# Patient Record
Sex: Female | Born: 1980 | Race: White | Hispanic: No | Marital: Single | Smoking: Never smoker
Health system: Southern US, Community
[De-identification: ages and names within clinical notes are randomized; demographics above are authoritative.]

---
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
## 2020-07-30 ENCOUNTER — Other Ambulatory Visit: Payer: Self-pay

## 2020-07-30 ENCOUNTER — Ambulatory Visit (INDEPENDENT_AMBULATORY_CARE_PROVIDER_SITE_OTHER): Payer: 59

## 2020-07-30 ENCOUNTER — Ambulatory Visit
Admission: RE | Admit: 2020-07-30 | Discharge: 2020-07-30 | Disposition: A | Discharge status: Home / Self Care | Payer: 59 | Source: Ambulatory Visit | Attending: Physician Assistant | Admitting: Physician Assistant

## 2020-07-30 VITALS — BP 117/72 | HR 80 | Temp 98.1°F | Resp 16

## 2020-07-30 DIAGNOSIS — S92355A Nondisplaced fracture of fifth metatarsal bone, left foot, initial encounter for closed fracture: Secondary | ICD-10-CM

## 2020-07-30 DIAGNOSIS — M79672 Pain in left foot: Secondary | ICD-10-CM

## 2020-07-30 DIAGNOSIS — W19XXXA Unspecified fall, initial encounter: Secondary | ICD-10-CM | POA: Diagnosis not present

## 2020-07-30 MED ORDER — HYDROCODONE-ACETAMINOPHEN 5-325 MG PO TABS: 1.0000 | ORAL_TABLET | Freq: Four times a day (QID) | ORAL | 0 refills | Status: AC | PRN

## 2020-07-30 NOTE — ED Provider Notes (Signed)
[facility]    CSN: [id number] Arrival date & time: 07/30/20  1505      History   Chief Complaint Chief Complaint  Patient presents with  . appt 3-foot injury    HPI Bianca Carpenter is a 39 y.o. female.   39 year old female comes in for left foot pain after injury earlier today.  States fell onto her buttock when the pack of dogs ran towards her from behind.  Since then, has had swelling, pain, contusion to the left lateral foot and has not been able to bear weight.  Has tried ice compress, elevation without relief.  Ibuprofen without relief.     History reviewed. No pertinent past medical history.  There are no problems to display for this patient.   History reviewed. No pertinent surgical history.  OB History   No obstetric history on file.      Home Medications    Prior to Admission medications   Medication Sig Start Date End Date Taking? Authorizing Provider  HYDROcodone-acetaminophen (NORCO/VICODIN) 5-325 MG tablet Take 1 tablet by mouth every 6 (six) hours as needed for severe pain. 07/30/20   Belinda Fisher, PA-C    Family History History reviewed. No pertinent family history.  Social History Social History   Tobacco Use  . Smoking status: Never Smoker  . Smokeless tobacco: Never Used  Substance Use Topics  . Alcohol use: Yes  . Drug use: Not Currently     Allergies   Patient has no known allergies.   Review of Systems Review of Systems  Reason unable to perform ROS: See HPI as above.     Physical Exam Triage Vital Signs ED Triage Vitals  Enc Vitals Group     BP 07/30/20 1537 117/72     Pulse Rate 07/30/20 1537 80     Resp 07/30/20 1537 16     Temp 07/30/20 1537 98.1 F (36.7 C)     Temp Source 07/30/20 1537 Oral     SpO2 07/30/20 1537 97 %     Weight --      Height --      Head Circumference --      Peak Flow --      Pain Score 07/30/20 1609 3     Pain Loc --      Pain Edu? --      Excl. in GC? --    No data  found.  Updated Vital Signs BP 117/72 (BP Location: Left Arm)   Pulse 80   Temp 98.1 F (36.7 C) (Oral)   Resp 16   LMP 07/16/2020   SpO2 97%   Physical Exam Constitutional:      General: She is not in acute distress.    Appearance: Normal appearance. She is well-developed. She is not toxic-appearing or diaphoretic.  HENT:     Head: Normocephalic and atraumatic.  Eyes:     Conjunctiva/sclera: Conjunctivae normal.     Pupils: Pupils are equal, round, and reactive to light.  Pulmonary:     Effort: Pulmonary effort is normal. No respiratory distress.  Musculoskeletal:     Cervical back: Normal range of motion and neck supple.     Comments: Swelling with contusion along proximal 4th-5th MTP.  No swelling to the ankle.  No tenderness to palpation of the ankle.  Tenderness along 4th-5th MTP. Full ROM of ankle. Decreased ROM of toes due to pain. NVI  Skin:    General: Skin is warm and dry.  Neurological:     Mental Status: She is alert and oriented to person, place, and time.      UC Treatments / Results  Labs (all labs ordered are listed, but only abnormal results are displayed) Labs Reviewed - No data to display  EKG   Radiology DG Foot Complete Left  Result Date: 07/30/2020 CLINICAL DATA:  Left foot pain after a fall today. Initial encounter. EXAM: LEFT FOOT - COMPLETE 3+ VIEW COMPARISON:  None. FINDINGS: The patient has a mildly comminuted but nondisplaced fracture of the base of the fifth metatarsal. The fracture extends to the articular surface of the calcaneocuboid joint. No other acute abnormality is identified. There is some soft tissue swelling about the patient's fracture. IMPRESSION: Acute, mildly comminuted but nondisplaced fracture base of the left fifth metatarsal. Electronically Signed   By: Drusilla Kanner M.D.   On: 07/30/2020 16:38    Procedures Procedures (including critical care time)  Medications Ordered in UC Medications - No data to  display  Initial Impression / Assessment and Plan / UC Course  I have reviewed the triage vital signs and the nursing notes.  Pertinent labs & imaging results that were available during my care of the patient were reviewed by me and considered in my medical decision making (see chart for details).    Discussed x-ray results with patient.  Crutches, Cam walker, patient to remain nonweightbearing until cleared by orthopedic.  Symptomatic treatment discussed.  Return precautions given.  Final Clinical Impressions(s) / UC Diagnoses   Final diagnoses:  Closed nondisplaced fracture of fifth metatarsal bone of left foot, initial encounter    ED Prescriptions    Medication Sig Dispense Auth. Provider   HYDROcodone-acetaminophen (NORCO/VICODIN) 5-325 MG tablet Take 1 tablet by mouth every 6 (six) hours as needed for severe pain. 10 tablet Belinda Fisher, PA-C     I have reviewed the PDMP during this encounter.   Belinda Fisher, PA-C 07/30/20 1703

## 2020-07-30 NOTE — ED Triage Notes (Signed)
[place] the dog park around 1130 today and a pack of dogs came behind her and knocked her down on her buttocks. Pt c/o pain, swelling, and bruising to lt foot. States unable to bear weight.

## 2020-07-30 NOTE — Discharge Instructions (Signed)
Start ibuprofen 800mg  three times a day. Tylenol 500mg -650mg  three times a day. Norco as needed for break through pain. Ice compress, elevation. Remain nonweightbearing until cleared by orthopedic.

## 2021-08-18 IMAGING — DX DG FOOT COMPLETE 3+V*L*
3 series · 3 of 3 positions shown · non-contrast
Comparison: None.

CLINICAL DATA: Left foot pain after a fall today. Initial
encounter.

EXAM:
LEFT FOOT - COMPLETE 3+ VIEW

[foot supine dp]
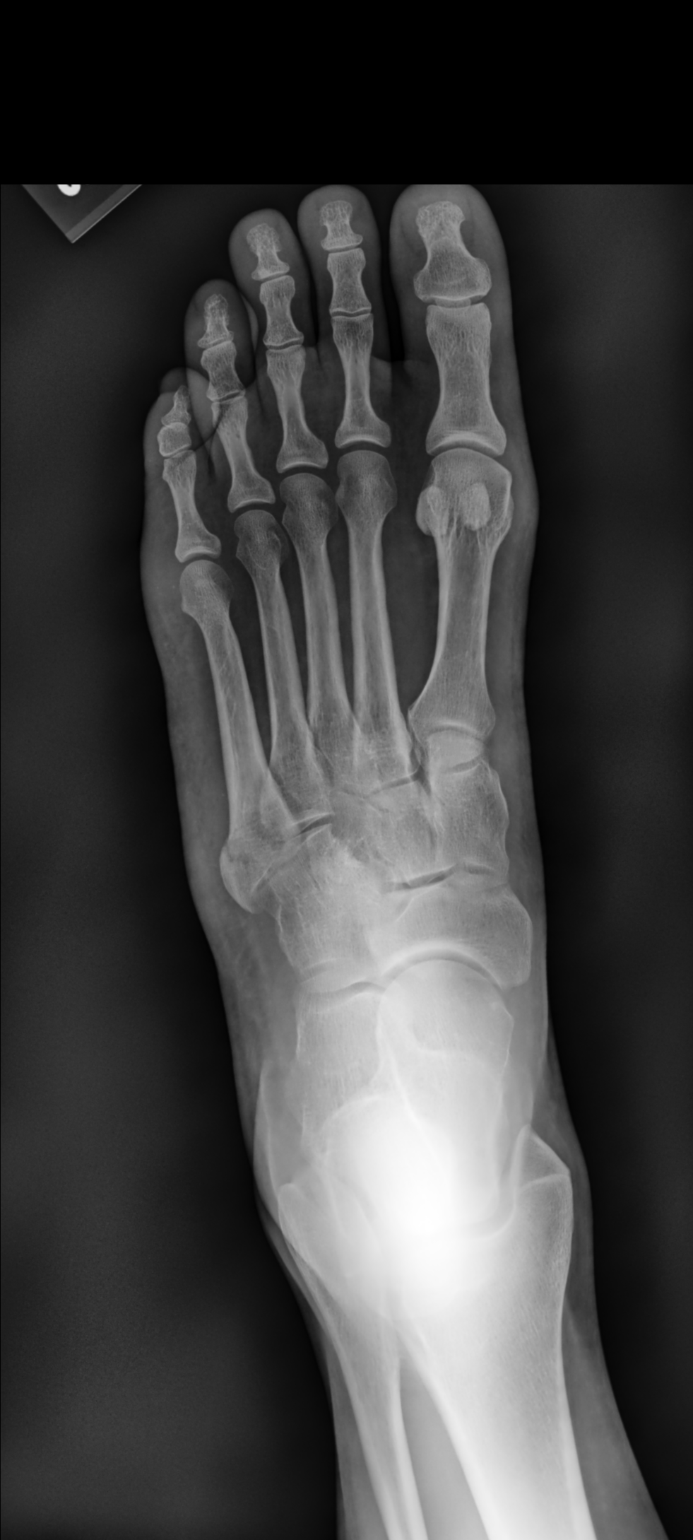

[foot medial oblique]
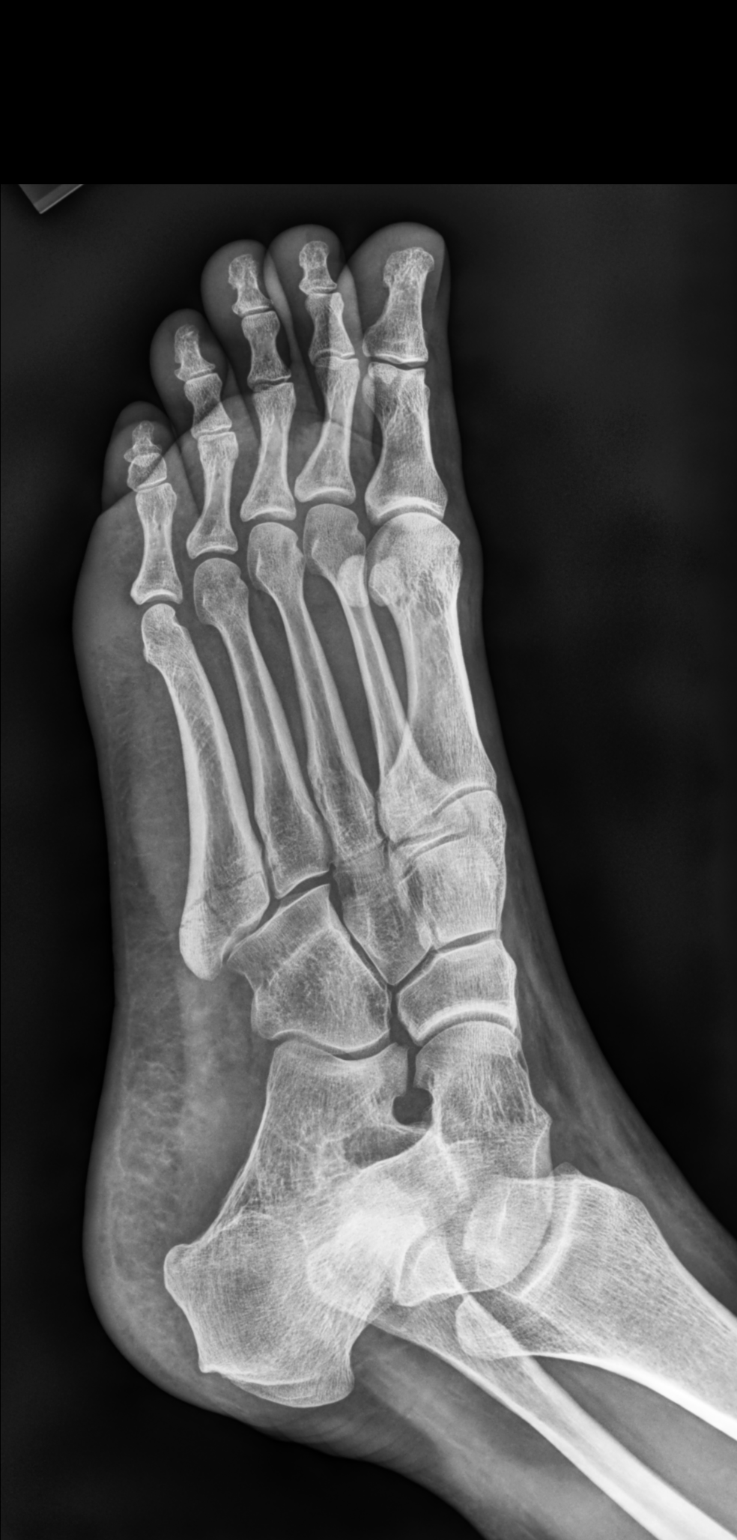

[foot supine lat]
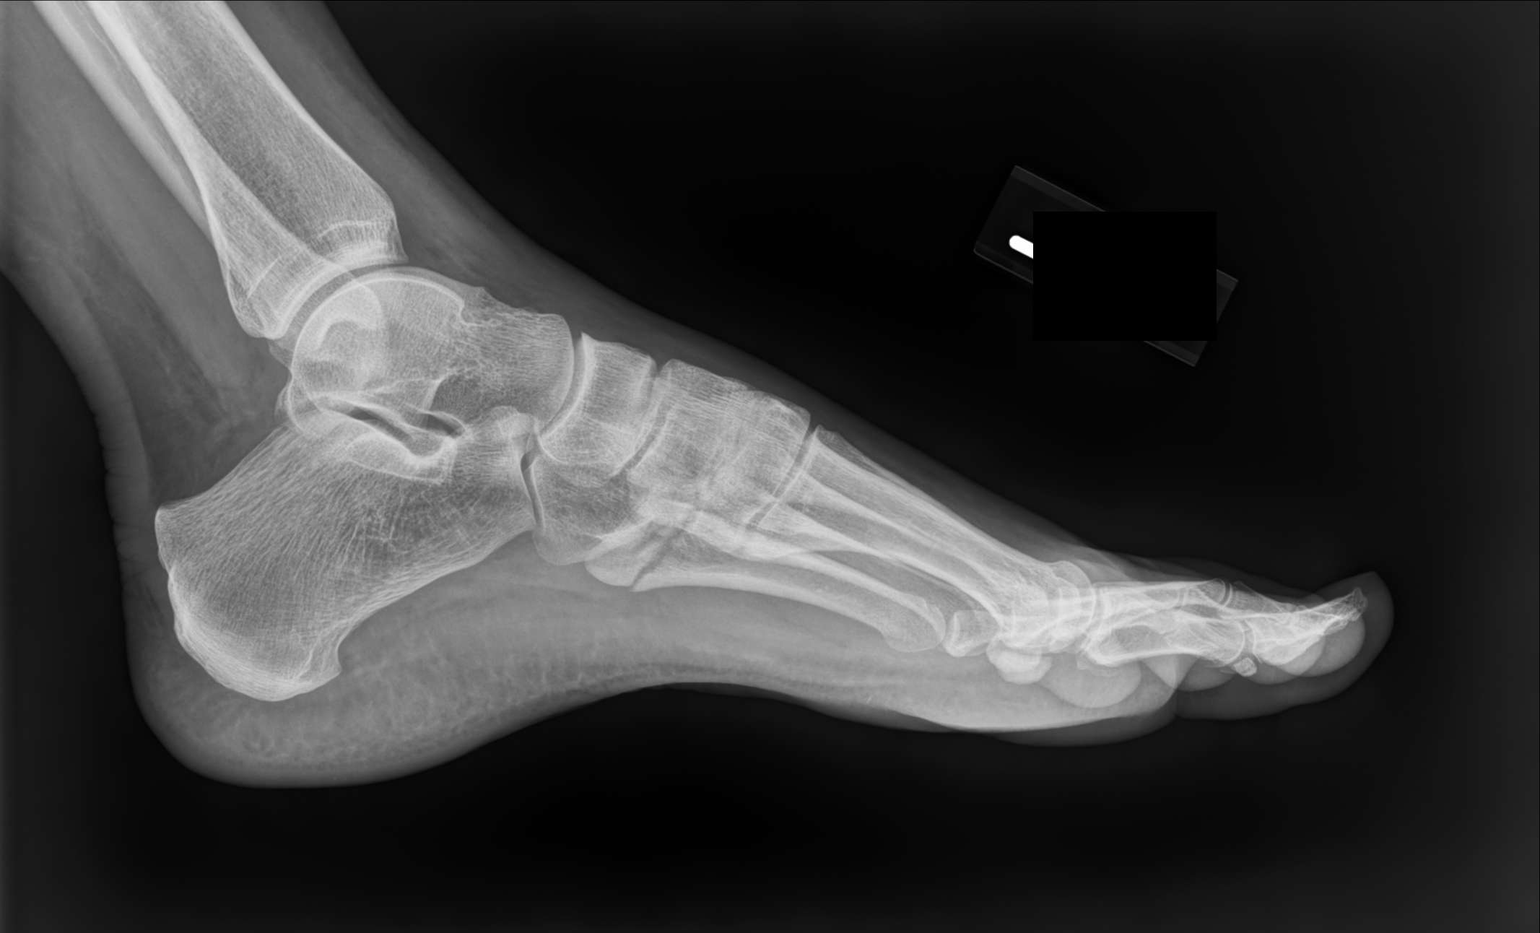

[3 of 3 positions shown; findings below may reference images not displayed]

FINDINGS: The patient has a mildly comminuted but nondisplaced fracture of the
base of the fifth metatarsal. The fracture extends to the articular
surface of the calcaneocuboid joint. No other acute abnormality is
identified. There is some soft tissue swelling about the patient's
fracture.
IMPRESSION: Acute, mildly comminuted but nondisplaced fracture base of the left
fifth metatarsal.
